# Patient Record
Sex: Female | Born: 1995 | Race: Black or African American | Hispanic: No | Marital: Single | Smoking: Never smoker
Health system: Southern US, Community
[De-identification: ages and names within clinical notes are randomized; demographics above are authoritative.]

## PROBLEM LIST (undated history)

## (undated) HISTORY — PX: ARTHROSCOPIC REPAIR ACL: SUR80

---

## 2017-01-04 ENCOUNTER — Emergency Department (HOSPITAL_COMMUNITY)
Admission: EM | Admit: 2017-01-04 | Discharge: 2017-01-04 | Disposition: A | Discharge status: Home / Self Care | Payer: Self-pay | Attending: Emergency Medicine | Admitting: Emergency Medicine

## 2017-01-04 ENCOUNTER — Encounter (HOSPITAL_COMMUNITY): Payer: Self-pay | Admitting: Oncology

## 2017-01-04 DIAGNOSIS — R112 Nausea with vomiting, unspecified: Secondary | ICD-10-CM | POA: Insufficient documentation

## 2017-01-04 DIAGNOSIS — R197 Diarrhea, unspecified: Secondary | ICD-10-CM | POA: Insufficient documentation

## 2017-01-04 LAB — CBC
HCT: 38.3 % (ref 36.0–46.0)
HEMOGLOBIN: 12.9 g/dL (ref 12.0–15.0)
MCH: 30 pg (ref 26.0–34.0)
MCHC: 33.7 g/dL (ref 30.0–36.0)
MCV: 89.1 fL (ref 78.0–100.0)
Platelets: 268 10*3/uL (ref 150–400)
RBC: 4.3 MIL/uL (ref 3.87–5.11)
RDW: 12.5 % (ref 11.5–15.5)
WBC: 12.1 10*3/uL — ABNORMAL HIGH (ref 4.0–10.5)

## 2017-01-04 LAB — URINALYSIS, ROUTINE W REFLEX MICROSCOPIC
Bilirubin Urine: NEGATIVE
Glucose, UA: NEGATIVE mg/dL
Hgb urine dipstick: NEGATIVE
Ketones, ur: NEGATIVE mg/dL
Leukocytes, UA: NEGATIVE
NITRITE: NEGATIVE
Protein, ur: NEGATIVE mg/dL
SPECIFIC GRAVITY, URINE: 1.025 (ref 1.005–1.030)
pH: 6 (ref 5.0–8.0)

## 2017-01-04 LAB — COMPREHENSIVE METABOLIC PANEL
ALBUMIN: 4.2 g/dL (ref 3.5–5.0)
ALK PHOS: 83 U/L (ref 38–126)
ALT: 24 U/L (ref 14–54)
ANION GAP: 7 (ref 5–15)
AST: 22 U/L (ref 15–41)
BILIRUBIN TOTAL: 0.5 mg/dL (ref 0.3–1.2)
BUN: 14 mg/dL (ref 6–20)
CALCIUM: 9.1 mg/dL (ref 8.9–10.3)
CO2: 23 mmol/L (ref 22–32)
Chloride: 107 mmol/L (ref 101–111)
Creatinine, Ser: 0.65 mg/dL (ref 0.44–1.00)
GFR calc non Af Amer: >60 mL/min (ref >60)
Glucose, Bld: 122 mg/dL — ABNORMAL HIGH (ref 65–99)
Potassium: 3.6 mmol/L (ref 3.5–5.1)
Sodium: 137 mmol/L (ref 135–145)
TOTAL PROTEIN: 7.6 g/dL (ref 6.5–8.1)

## 2017-01-04 LAB — LIPASE, BLOOD: LIPASE: 25 U/L (ref 11–51)

## 2017-01-04 NOTE — ED Provider Notes (Signed)
[facility]-EMERGENCY DEPT Provider Note   CSN: [id number] Arrival date & time: 01/04/17  0129     History   Chief Complaint Chief Complaint  Patient presents with  . Abdominal Pain    HPI Barbara Mccullough is a 21 y.o. female.  This normally healthy 21 year old female who moved into her apartment yesterday to restart school tomorrow.  She was lying in bed when she had a sudden onset of lower abdominal pain, nausea and explosive diarrheal stool since that time .  Pain has decreased.  She's no longer nauseated, nor feeling like she has to have a bowel movement.  Denies any fever but states she had a chill.      History reviewed. No pertinent past medical history.  There are no active problems to display for this patient.   Past Surgical History:  Procedure Laterality Date  . ARTHROSCOPIC REPAIR ACL Left     OB History    No data available       Home Medications    Prior to Admission medications   Not on File    Family History No family history on file.  Social History Social History  Substance Use Topics  . Smoking status: Never Smoker  . Smokeless tobacco: Never Used  . Alcohol use No     Allergies   Patient has no known allergies.   Review of Systems Review of Systems  Constitutional: Positive for chills.  Gastrointestinal: Positive for abdominal pain, diarrhea, nausea and vomiting.  Genitourinary: Negative for dysuria and vaginal discharge.  Musculoskeletal: Negative for myalgias.  Neurological: Negative.   Hematological: Negative.   Psychiatric/Behavioral: Negative.   All other systems reviewed and are negative.    Physical Exam Updated Vital Signs BP 141/91 (BP Location: Right Arm)   Pulse 92   Temp 98.7 F (37.1 C) (Oral)   Resp 18   Ht 5\' 7"  (1.702 m)   Wt 72.6 kg   LMP 12/11/2016 (Exact Date)   SpO2 100%   BMI 25.06 kg/m   Physical Exam  Constitutional: She is oriented to person, place, and time. She appears well-developed and  well-nourished. No distress.  HENT:  Head: Normocephalic.  Mouth/Throat: Oropharynx is clear and moist.  Eyes: Pupils are equal, round, and reactive to light.  Neck: Normal range of motion.  Cardiovascular: Regular rhythm.  Tachycardia present.   Pulmonary/Chest: Effort normal and breath sounds normal.  Abdominal: Soft.  Musculoskeletal: Normal range of motion.  Neurological: She is alert and oriented to person, place, and time.  Skin: Skin is warm and dry.  Psychiatric: She has a normal mood and affect.  Nursing note and vitals reviewed.    ED Treatments / Results  Labs (all labs ordered are listed, but only abnormal results are displayed) Labs Reviewed  COMPREHENSIVE METABOLIC PANEL - Abnormal; Notable for the following:       Result Value   Glucose, Bld 122 (*)    All other components within normal limits  CBC - Abnormal; Notable for the following:    WBC 12.1 (*)    All other components within normal limits  LIPASE, BLOOD  URINALYSIS, ROUTINE W REFLEX MICROSCOPIC    EKG  EKG Interpretation None       Radiology No results found.  Procedures Procedures (including critical care time)  Medications Ordered in ED Medications - No data to display   Initial Impression / Assessment and Plan / ED Course  I have reviewed the triage vital signs and the  nursing notes.  Pertinent labs & imaging results that were available during my care of the patient were reviewed by me and considered in my medical decision making (see chart for details).  Clinical Course    Patient was evaluated.  She had one episode of acute abdominal pain followed by nausea, vomiting and diarrhea.  She's had no further episodes.  Labs and urine checked, all within normal parameters.  Patient is being discharged home with return instructions   Final Clinical Impressions(s) / ED Diagnoses   Final diagnoses:  Nausea vomiting and diarrhea    New Prescriptions New Prescriptions   No medications  on file     Earley Favor, NP 01/04/17 1610    Gilda Crease, MD 01/05/17 [phone]

## 2017-01-04 NOTE — ED Triage Notes (Signed)
Pt c/o right middle lower abdominal pain x 2 hours.  +nausea.  Rates pain 5/10, aching in nature.

## 2017-01-04 NOTE — Discharge Instructions (Signed)
The night, you were evaluated after he had acute onset of abdominal pain, nausea, vomiting and diarrhea, one episode of the time he arrived in the emergency department your pain was starting to subside and you've had no further episodes of vomiting or diarrhea.  Labs were checked as well as urine all within normal parameters.  Please eat lightly for the next 24 hours.  Return if needed.

## 2020-03-09 ENCOUNTER — Emergency Department (HOSPITAL_COMMUNITY)
Admission: EM | Admit: 2020-03-09 | Discharge: 2020-03-09 | Disposition: A | Discharge status: Home / Self Care | Payer: Managed Care, Other (non HMO) | Attending: Emergency Medicine | Admitting: Emergency Medicine

## 2020-03-09 ENCOUNTER — Other Ambulatory Visit: Payer: Self-pay

## 2020-03-09 DIAGNOSIS — J351 Hypertrophy of tonsils: Secondary | ICD-10-CM | POA: Insufficient documentation

## 2020-03-09 LAB — CBC WITH DIFFERENTIAL/PLATELET
Abs Immature Granulocytes: 0.02 10*3/uL (ref 0.00–0.07)
Basophils Absolute: 0.2 10*3/uL — ABNORMAL HIGH (ref 0.0–0.1)
Basophils Relative: 2 %
Eosinophils Absolute: 0 10*3/uL (ref 0.0–0.5)
Eosinophils Relative: 0 %
HCT: 46.8 % — ABNORMAL HIGH (ref 36.0–46.0)
Hemoglobin: 15.4 g/dL — ABNORMAL HIGH (ref 12.0–15.0)
Immature Granulocytes: 0 %
Lymphocytes Relative: 66 %
Lymphs Abs: 6.5 10*3/uL — ABNORMAL HIGH (ref 0.7–4.0)
MCH: 30.4 pg (ref 26.0–34.0)
MCHC: 32.9 g/dL (ref 30.0–36.0)
MCV: 92.5 fL (ref 80.0–100.0)
Monocytes Absolute: 0.5 10*3/uL (ref 0.1–1.0)
Monocytes Relative: 5 %
Neutro Abs: 2.6 10*3/uL (ref 1.7–7.7)
Neutrophils Relative %: 27 %
Platelets: 205 10*3/uL (ref 150–400)
RBC: 5.06 MIL/uL (ref 3.87–5.11)
RDW: 12.1 % (ref 11.5–15.5)
WBC: 9.8 10*3/uL (ref 4.0–10.5)
nRBC: 0 % (ref 0.0–0.2)

## 2020-03-09 LAB — BASIC METABOLIC PANEL
Anion gap: 10 (ref 5–15)
BUN: 8 mg/dL (ref 6–20)
CO2: 24 mmol/L (ref 22–32)
Calcium: 9.1 mg/dL (ref 8.9–10.3)
Chloride: 103 mmol/L (ref 98–111)
Creatinine, Ser: 0.69 mg/dL (ref 0.44–1.00)
GFR calc Af Amer: >60 mL/min (ref >60)
GFR calc non Af Amer: >60 mL/min (ref >60)
Glucose, Bld: 94 mg/dL (ref 70–99)
Potassium: 3.9 mmol/L (ref 3.5–5.1)
Sodium: 137 mmol/L (ref 135–145)

## 2020-03-09 LAB — POC URINE PREG, ED: Preg Test, Ur: NEGATIVE

## 2020-03-09 MED ORDER — DEXAMETHASONE SODIUM PHOSPHATE 10 MG/ML IJ SOLN
10.0000 mg | Freq: Once | INTRAMUSCULAR | Status: AC
  Administered 2020-03-09: 10 mg via INTRAVENOUS
  Filled 2020-03-09: qty 1

## 2020-03-09 MED ORDER — CLINDAMYCIN PHOSPHATE 600 MG/50ML IV SOLN
600.0000 mg | Freq: Once | INTRAVENOUS | Status: AC
  Administered 2020-03-09: 600 mg via INTRAVENOUS
  Filled 2020-03-09: qty 50

## 2020-03-09 MED ORDER — SODIUM CHLORIDE 0.9 % IV BOLUS
1000.0000 mL | Freq: Once | INTRAVENOUS | Status: AC
  Administered 2020-03-09: 1000 mL via INTRAVENOUS

## 2020-03-09 MED ORDER — CLINDAMYCIN HCL 150 MG PO CAPS: 300.0000 mg | ORAL_CAPSULE | Freq: Three times a day (TID) | ORAL | 0 refills | Status: AC

## 2020-03-09 NOTE — ED Triage Notes (Signed)
Patient reports her right tonsil is swollen. Denies pain. Patient went to UC and tested negative for strep and covid at [facility] today. UC sent patient to ED to get ct scan to make sure patient doesn't have abscess on tonsil area.

## 2020-03-09 NOTE — Discharge Instructions (Signed)
Take antibiotics as prescribed.  Take the entire course, even if your symptoms improve. Make sure you are staying well-hydrated water. Use Tylenol or ibuprofen as needed for pain. Monitor your tubes closely.  If you develop persistent fevers, severe worsening pain, muffled voice, difficulty opening your mouth, return to the emergency room.  Return if any new, worsening, concerning symptoms.

## 2020-03-09 NOTE — ED Notes (Signed)
Pt verbalizes understanding of DC instructions. Pt belongings returned and is ambulatory out of ED.    Pt left before signing signature pad  

## 2020-03-09 NOTE — ED Provider Notes (Signed)
[facility] DEPT Provider Note   CSN: [id number] Arrival date & time: 03/09/20  1731     History Chief Complaint  Patient presents with  . swollen tonsil    right    Barbara Mccullough is a 24 y.o. female presenting for evaluation of right-sided tonsillar swelling.  Patient states she noticed swelling of her right side throat this morning.  She denies throat pain.  She denies fevers, chills, nausea, vomiting.  She was seen at urgent care, recommend she come to the ER as they were concerned for possible tonsillar abscess.  She reports no medical problems, takes medications daily.  She has not taken anything for pain.  She is not given anything at the urgent care.  She denies sick contacts.  She denies cough, chest pain, shortness of breath.  HPI     No past medical history on file.  There are no problems to display for this patient.   Past Surgical History:  Procedure Laterality Date  . ARTHROSCOPIC REPAIR ACL Left      OB History   No obstetric history on file.     No family history on file.  Social History   Tobacco Use  . Smoking status: Never Smoker  . Smokeless tobacco: Never Used  Substance Use Topics  . Alcohol use: No  . Drug use: No    Home Medications Prior to Admission medications   Medication Sig Start Date End Date Taking? Authorizing Provider  clindamycin (CLEOCIN) 150 MG capsule Take 2 capsules (300 mg total) by mouth 3 (three) times daily for 7 days. 03/09/20 03/16/20  Krishawn Vanderweele, PA-C    Allergies    Patient has no known allergies.  Review of Systems   Review of Systems  Constitutional: Negative for fever.  HENT:       Right-sided throat swelling    Physical Exam Updated Vital Signs BP (!) 146/107 (BP Location: Left Arm)   Pulse 92   Temp 99.3 F (37.4 C) (Oral)   Resp 18   Ht 5\' 6"  (1.676 m)   LMP 02/08/2020   SpO2 100%   BMI 25.82 kg/m   Physical Exam Vitals and nursing note reviewed.    Constitutional:      General: She is not in acute distress.    Appearance: She is well-developed.     Comments: Resting comfortably in the bed in no acute distress.  Appears nontoxic.  HENT:     Head: Normocephalic and atraumatic.     Right Ear: Tympanic membrane, ear canal and external ear normal.     Left Ear: Tympanic membrane, ear canal and external ear normal.     Nose: Nose normal.     Mouth/Throat:     Tonsils: Tonsillar exudate present. 1+ on the right. 0 on the left.     Comments: Mild right-sided tonsillar swelling with exudate.  No swelling of the left tonsil.  Uvula midline without deviation.  No trismus.  No muffled voice. Eyes:     Conjunctiva/sclera: Conjunctivae normal.     Pupils: Pupils are equal, round, and reactive to light.  Cardiovascular:     Rate and Rhythm: Normal rate and regular rhythm.     Pulses: Normal pulses.  Pulmonary:     Effort: Pulmonary effort is normal. No respiratory distress.     Breath sounds: Normal breath sounds. No wheezing.     Comments: Clear lung sounds in all fields Abdominal:     General: There  is no distension.     Palpations: Abdomen is soft.     Tenderness: There is no abdominal tenderness.  Musculoskeletal:        General: Normal range of motion.     Cervical back: Normal range of motion and neck supple.  Skin:    General: Skin is warm and dry.     Capillary Refill: Capillary refill takes less than 2 seconds.  Neurological:     Mental Status: She is alert and oriented to person, place, and time.     ED Results / Procedures / Treatments   Labs (all labs ordered are listed, but only abnormal results are displayed) Labs Reviewed  CBC WITH DIFFERENTIAL/PLATELET - Abnormal; Notable for the following components:      Result Value   Hemoglobin 15.4 (*)    HCT 46.8 (*)    All other components within normal limits  BASIC METABOLIC PANEL  POC URINE PREG, ED    EKG None  Radiology No results  found.  Procedures Procedures (including critical care time)  Medications Ordered in ED Medications  dexamethasone (DECADRON) injection 10 mg (10 mg Intravenous Given 03/09/20 1858)  sodium chloride 0.9 % bolus 1,000 mL (1,000 mLs Intravenous New Bag/Given 03/09/20 1859)  clindamycin (CLEOCIN) IVPB 600 mg (600 mg Intravenous New Bag/Given 03/09/20 1902)    ED Course  I have reviewed the triage vital signs and the nursing notes.  Pertinent labs & imaging results that were available during my care of the patient were reviewed by me and considered in my medical decision making (see chart for details).  Clinical Course as of Mar 09 1934  Sat Mar 09, 2020  6659 Patient was seen by myself as well as PA provider.  Briefly 24 year old female with no significant past med history presents to ED from urgent care with concern for tonsillar pain and swelling.  He said her symptoms began earlier this morning.  She went to an urgent care when she was told she needs to come to the ED for a CT scan because "she may have a tonsillar abscess."  She denies any drooling.  She denies any fevers or chills.  She denies any difficulty speaking or any changes in her voice.  She denies any prior history of peritonsillar abscess or strep throat.  On exam she is clinically well-appearing.  Her voice is clear not muffled.  There is very mild tonsillar enlargement on the right side but no significant swelling, no uvula deviation.  Her airways pain.  I do not believe she needs a CT scan.  I not believe there is a drainable collection at this time.  We will give her dexamethasone and start her on antibiotics.  Advised her that if her symptoms worsen she should come back to the emergency department, and may need a scan and drainage at that time.  She is content with this plan.   [MT]    Clinical Course User Index [MT] Trifan, Kermit Balo, MD   MDM Rules/Calculators/A&P                      Patient presenting for evaluation of  right-sided tonsillar swelling.  Physical exam reassuring, she appears nontoxic.  She has no trismus or muffled voice.  No uvular deviation.  She has a mildly swollen right side tonsil with exudate.  Exam is not consistent with peritonsillar abscess.  She is afebrile not tachycardic.  Labs interpreted by me, overall reassuring. No leukocytosis.  Electrolytes stable.  Case discussed with attending, Dr. Sharmaine Base evaluated the patient.  He and I agree that exam is not consistent with peritonsillar abscess and patient does not need a CT scan today.  Patient given Decadron, fluids, and clindamycin in the ED.  Will discharge with clindamycin.  Discussed importance of close monitoring of symptoms, prompt return with any trismus or muffled voice.  At this time, patient appears safe for discharge.  Return precautions given.  Patient states she understands and agrees to plan.  Final Clinical Impression(s) / ED Diagnoses Final diagnoses:  Swelling of tonsil    Rx / DC Orders ED Discharge Orders         Ordered    clindamycin (CLEOCIN) 150 MG capsule  3 times daily     03/09/20 1934           Alveria Apley, PA-C 03/09/20 2145    Terald Sleeper, MD 03/10/20 1218

## 2020-03-15 ENCOUNTER — Ambulatory Visit: Payer: Managed Care, Other (non HMO)

## 2020-03-23 ENCOUNTER — Ambulatory Visit: Payer: Managed Care, Other (non HMO) | Attending: Internal Medicine

## 2020-03-23 DIAGNOSIS — Z23 Encounter for immunization: Secondary | ICD-10-CM

## 2020-03-23 NOTE — Progress Notes (Signed)
   Covid-19 Vaccination Clinic  Name:  Barbara Mccullough    MRN: [id number] DOB: 1996/09/08  03/23/2020  [name] was observed post Covid-19 immunization for 15 minutes without incident. She was provided with Vaccine Information Sheet and instruction to access the V-Safe system.   [name] was instructed to call 911 with any severe reactions post vaccine: Marland Kitchen Difficulty breathing  . Swelling of face and throat  . A fast heartbeat  . A bad rash all over body  . Dizziness and weakness   Immunizations Administered    Name Date Dose VIS Date Route   Pfizer COVID-19 Vaccine 03/23/2020  2:33 PM 0.3 mL 12/08/2019 Intramuscular   Manufacturer: [employer], [employer]   Lot: [id number]   NDC: [address]-1

## 2020-04-17 ENCOUNTER — Ambulatory Visit: Payer: Managed Care, Other (non HMO) | Attending: Internal Medicine

## 2020-04-17 DIAGNOSIS — Z23 Encounter for immunization: Secondary | ICD-10-CM

## 2020-04-17 NOTE — Progress Notes (Signed)
   Covid-19 Vaccination Clinic  Name:  Barbara Mccullough    MRN: [id number] DOB: April 10, 1996  04/17/2020  [name] was observed post Covid-19 immunization for 15 minutes without incident. She was provided with Vaccine Information Sheet and instruction to access the V-Safe system.   [name] was instructed to call 911 with any severe reactions post vaccine: Marland Kitchen Difficulty breathing  . Swelling of face and throat  . A fast heartbeat  . A bad rash all over body  . Dizziness and weakness   Immunizations Administered    Name Date Dose VIS Date Route   Pfizer COVID-19 Vaccine 04/17/2020  3:15 PM 0.3 mL 02/21/2019 Intramuscular   Manufacturer: [employer], [employer]   Lot: [id number]   NDC: [address]-5

## 2021-02-23 ENCOUNTER — Encounter: Payer: Self-pay | Admitting: *Deleted

## 2021-02-23 ENCOUNTER — Ambulatory Visit
Admission: EM | Admit: 2021-02-23 | Discharge: 2021-02-23 | Disposition: A | Discharge status: Home / Self Care | Payer: Managed Care, Other (non HMO)

## 2021-02-23 ENCOUNTER — Other Ambulatory Visit: Payer: Self-pay

## 2021-02-23 ENCOUNTER — Ambulatory Visit (INDEPENDENT_AMBULATORY_CARE_PROVIDER_SITE_OTHER): Payer: Managed Care, Other (non HMO)

## 2021-02-23 DIAGNOSIS — M79644 Pain in right finger(s): Secondary | ICD-10-CM

## 2021-02-23 DIAGNOSIS — S63639A Sprain of interphalangeal joint of unspecified finger, initial encounter: Secondary | ICD-10-CM | POA: Diagnosis not present

## 2021-02-23 DIAGNOSIS — W109XXA Fall (on) (from) unspecified stairs and steps, initial encounter: Secondary | ICD-10-CM | POA: Diagnosis not present

## 2021-02-23 MED ORDER — IBUPROFEN 600 MG PO TABS: 600.0000 mg | ORAL_TABLET | Freq: Four times a day (QID) | ORAL | 0 refills | Status: AC | PRN

## 2021-02-23 NOTE — Discharge Instructions (Addendum)
Take 600 mg of ibuprofen combined with 1000 mg of Tylenol together 3-4 times a day as needed for pain.  Wear the splint at all times for the next week or so at least.  Follow-up with hand if you are not better in 3 to 5 weeks.

## 2021-02-23 NOTE — ED Provider Notes (Signed)
HPI  SUBJECTIVE:  Barbara Mccullough is a right-handed 25 y.o. female who presents with right ring finger pain, swelling after falling down some stairs, hyperflexing her finger 3 weeks ago.  She states the pain is located at the PIP.  No bruising, erythema, numbness or tingling, limitation of motion.  She tried ice which made her symptoms worse. No  Alleviating factors.  Past medical history negative for diabetes, smoking.  LMP: Mid of last month.  Denies possibility pregnant.  PMD: None.    History reviewed. No pertinent past medical history.  Past Surgical History:  Procedure Laterality Date  . ARTHROSCOPIC REPAIR ACL Left     Family History  Problem Relation Age of Onset  . Healthy Mother     Social History   Tobacco Use  . Smoking status: Never Smoker  . Smokeless tobacco: Never Used  Vaping Use  . Vaping Use: Never used  Substance Use Topics  . Alcohol use: No  . Drug use: No    No current facility-administered medications for this encounter.  Current Outpatient Medications:  .  ibuprofen (ADVIL) 600 MG tablet, Take 1 tablet (600 mg total) by mouth every 6 (six) hours as needed., Disp: 30 tablet, Rfl: 0 .  UNKNOWN TO PATIENT, Oral contraceptives, Disp: , Rfl:   No Known Allergies   ROS  As noted in HPI.   Physical Exam  BP (!) 140/92   Pulse 80   Temp 98.2 F (36.8 C) (Oral)   Resp 16   LMP 01/15/2021 (Approximate) Comment: Pt takes active back-to-back cycles of oral contraceptives  SpO2 98%   Constitutional: Well developed, well nourished, no acute distress Eyes:  EOMI, conjunctiva normal bilaterally HENT: Normocephalic, atraumatic,mucus membranes moist Respiratory: Normal inspiratory effort Cardiovascular: Normal rate GI: nondistended skin: No rash, skin intact Musculoskeletal: Right ring finger: Tenderness at PIP.  Mild laxity on varus/valgus stress, but not unstable.  No other finger tenderness.  Cap refill less than 2 seconds.  FDP/FDS is intact.   Sensation grossly intact distally.  No swelling, bruising, erythema.  Skin skin intact.  Rest of hand, wrist nontender and within normal limits.   Neurologic: Alert & oriented x 3, no focal neuro deficits Psychiatric: Speech and behavior appropriate   ED Course   Medications - No data to display  Orders Placed This Encounter  Procedures  . DG Finger Ring Right    Standing Status:   Standing    Number of Occurrences:   1    Order Specific Question:   Reason for Exam (SYMPTOM  OR DIAGNOSIS REQUIRED)    Answer:   trauma; pain, decreased ROM    No results found for this or any previous visit (from the past 24 hour(s)). DG Finger Ring Right  Result Date: 02/23/2021 CLINICAL DATA:  falling up some stairs approx 3 wks ago, injuring right ring finger. C/O continued pain and painful ROM of right 4th finger. EXAM: RIGHT RING FINGER 2+V COMPARISON:  None. FINDINGS: There is no evidence of fracture or dislocation. There is no evidence of arthropathy or other focal bone abnormality. Soft tissues are unremarkable. IMPRESSION: No acute osseous abnormality. Electronically Signed   By: Maudry Mayhew MD   On: 02/23/2021 16:24    ED Clinical Impression  1. Sprain of proximal interphalangeal (PIP) joint of finger      ED Assessment/Plan  Reviewed imaging independently.  Normal finger.  See radiology report for full details.  Patient with a PIP sprain.  Mild laxity,  but it is not unstable.  She is neurovascularly intact.  Tendons intact.  Will place in splint, home with Tylenol/ibuprofen.  Follow-up with hand Ortho care [facility] if not better in 3 to 5 weeks.  Discussed imaging, MDM, treatment plan, and plan for follow-up with patient. patient agrees with plan.   Meds ordered this encounter  Medications  . ibuprofen (ADVIL) 600 MG tablet    Sig: Take 1 tablet (600 mg total) by mouth every 6 (six) hours as needed.    Dispense:  30 tablet    Refill:  0    *This clinic note was created  using Scientist, clinical (histocompatibility and immunogenetics). Therefore, there may be occasional mistakes despite careful proofreading.   ?    Domenick Gong, MD 02/24/21 [phone]

## 2021-02-23 NOTE — ED Triage Notes (Signed)
Pt reports falling up some stairs approx 3 wks ago, injuring right ring finger.  C/O continued pain and painful ROM of right 4th finger.

## 2021-04-25 LAB — HM HEPATITIS C SCREENING LAB: HM Hepatitis Screen: NEGATIVE

## 2021-04-25 LAB — HM HIV SCREENING LAB: HM HIV Screening: NEGATIVE

## 2021-04-25 LAB — HIV ANTIBODY (ROUTINE TESTING W REFLEX): HIV 1&2 Ab, 4th Generation: NONREACTIVE

## 2022-04-14 IMAGING — DX DG FINGER RING 2+V*R*
3 series · 3 of 3 positions shown · non-contrast
Comparison: None.

CLINICAL DATA: falling up some stairs approx 3 wks ago, injuring
right ring finger. C/O continued pain and painful ROM of right 4th
finger.

EXAM:
RIGHT RING FINGER 2+V

[finger pa (1 of 2)]
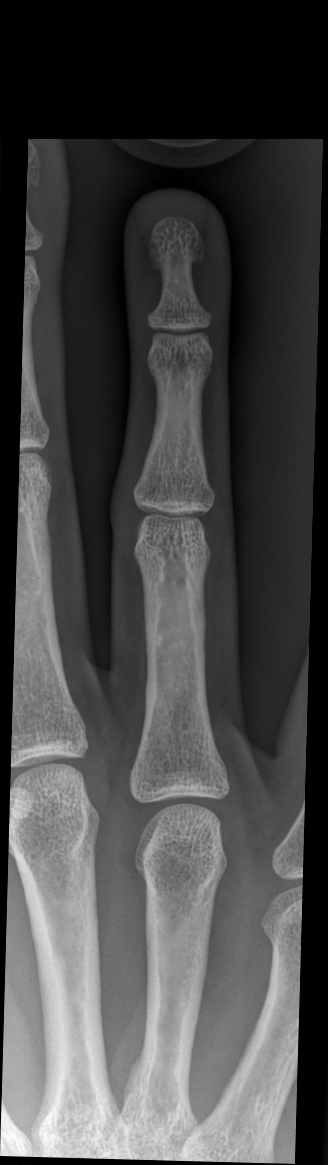

[finger pa (2 of 2)]
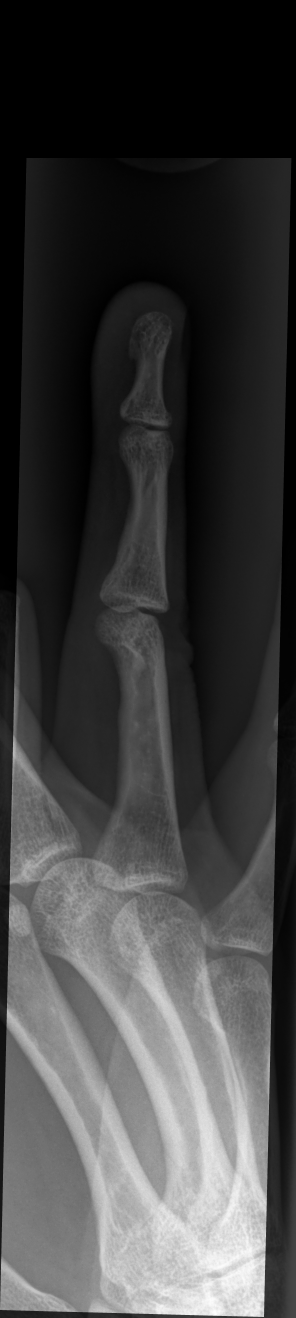

[finger lat]
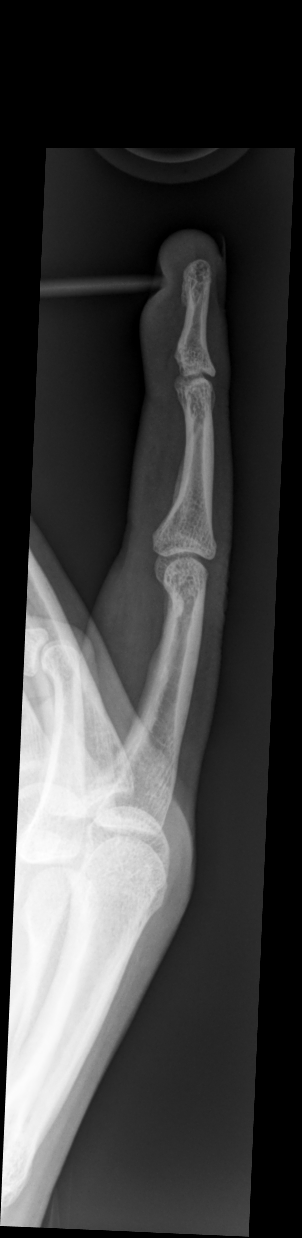

[3 of 3 positions shown; findings below may reference images not displayed]

FINDINGS: There is no evidence of fracture or dislocation. There is no
evidence of arthropathy or other focal bone abnormality. Soft
tissues are unremarkable.
IMPRESSION: No acute osseous abnormality.

## 2022-08-18 LAB — HM PAP SMEAR

## 2023-10-25 ENCOUNTER — Ambulatory Visit
Admission: EM | Admit: 2023-10-25 | Discharge: 2023-10-25 | Disposition: A | Discharge status: Home / Self Care | Payer: 59 | Attending: Internal Medicine | Admitting: Internal Medicine

## 2023-10-25 DIAGNOSIS — R350 Frequency of micturition: Secondary | ICD-10-CM | POA: Diagnosis present

## 2023-10-25 LAB — POCT URINALYSIS DIP (MANUAL ENTRY)
Bilirubin, UA: NEGATIVE
Blood, UA: NEGATIVE
Glucose, UA: NEGATIVE mg/dL
Ketones, POC UA: NEGATIVE mg/dL
Leukocytes, UA: NEGATIVE
Nitrite, UA: NEGATIVE
Protein Ur, POC: 30 mg/dL — AB
Spec Grav, UA: 1.02 (ref 1.010–1.025)
Urobilinogen, UA: 0.2 U/dL
pH, UA: 7 (ref 5.0–8.0)

## 2023-10-25 LAB — POCT URINE PREGNANCY: Preg Test, Ur: NEGATIVE

## 2023-10-25 MED ORDER — PHENAZOPYRIDINE HCL 200 MG PO TABS: 200.0000 mg | ORAL_TABLET | Freq: Three times a day (TID) | ORAL | 0 refills | Status: DC

## 2023-10-25 NOTE — ED Provider Notes (Signed)
[facility]    CSN: [id number] Arrival date & time: 10/25/23  4098      History   Chief Complaint No chief complaint on file.   HPI Barbara Mccullough is a 27 y.o. female presents for evaluation of UTI symptoms. Pt reports 3 days of urinary frequency, and bladder fullness after urination.  Denies urinary burning, urgency, hematuria, malodorous urine, fevers, nausea/vomiting, flank pain.  No vaginal discharge or STD concern. No abd pain or bloating. She has been drinking a lot of water of the past few months in an effort to be healthy.  No OTC medications have been used since onset.  No other concerns at this time.  HPI  History reviewed. No pertinent past medical history.  There are no problems to display for this patient.   Past Surgical History:  Procedure Laterality Date   ARTHROSCOPIC REPAIR ACL Left     OB History   No obstetric history on file.      Home Medications    Prior to Admission medications   Medication Sig Start Date End Date Taking? Authorizing Provider  phenazopyridine (PYRIDIUM) 200 MG tablet Take 1 tablet (200 mg total) by mouth 3 (three) times daily. 10/25/23  Yes Radford Pax, NP  UNKNOWN TO PATIENT Oral contraceptives   Yes [provider]  ibuprofen (ADVIL) 600 MG tablet Take 1 tablet (600 mg total) by mouth every 6 (six) hours as needed. 02/23/21   Domenick Gong, MD    Family History Family History  Problem Relation Age of Onset   Healthy Mother     Social History Social History   Tobacco Use   Smoking status: Never   Smokeless tobacco: Never  Vaping Use   Vaping status: Never Used  Substance Use Topics   Alcohol use: No   Drug use: No     Allergies   Patient has no known allergies.   Review of Systems Review of Systems  Genitourinary:  Positive for frequency.     Physical Exam Triage Vital Signs ED Triage Vitals  Encounter Vitals Group     BP 10/25/23 0837 (!) 151/96     Systolic BP Percentile  --      Diastolic BP Percentile --      Pulse Rate 10/25/23 0837 (!) 103     Resp 10/25/23 0837 16     Temp 10/25/23 0837 99.2 F (37.3 C)     Temp Source 10/25/23 0837 Oral     SpO2 10/25/23 0837 98 %     Weight --      Height --      Head Circumference --      Peak Flow --      Pain Score 10/25/23 0835 0     Pain Loc --      Pain Education --      Exclude from Growth Chart --    No data found.  Updated Vital Signs BP (!) 151/96 (BP Location: Right Arm)   Pulse (!) 103   Temp 99.2 F (37.3 C) (Oral)   Resp 16   LMP 09/16/2023 (Approximate)   SpO2 98%   Visual Acuity Right Eye Distance:   Left Eye Distance:   Bilateral Distance:    Right Eye Near:   Left Eye Near:    Bilateral Near:     Physical Exam Vitals and nursing note reviewed.  Constitutional:      Appearance: Normal appearance.  HENT:     Head:  Normocephalic and atraumatic.  Eyes:     Pupils: Pupils are equal, round, and reactive to light.  Cardiovascular:     Rate and Rhythm: Normal rate.  Pulmonary:     Effort: Pulmonary effort is normal.  Abdominal:     Tenderness: There is no right CVA tenderness or left CVA tenderness.  Skin:    General: Skin is warm and dry.  Neurological:     General: No focal deficit present.     Mental Status: She is alert and oriented to person, place, and time.  Psychiatric:        Mood and Affect: Mood normal.        Behavior: Behavior normal.      UC Treatments / Results  Labs (all labs ordered are listed, but only abnormal results are displayed) Labs Reviewed  POCT URINALYSIS DIP (MANUAL ENTRY) - Abnormal; Notable for the following components:      Result Value   Protein Ur, POC =30 (*)    All other components within normal limits  URINE CULTURE  POCT URINE PREGNANCY    EKG   Radiology No results found.  Procedures Procedures (including critical care time)  Medications Ordered in UC Medications - No data to display  Initial Impression /  Assessment and Plan / UC Course  I have reviewed the triage vital signs and the nursing notes.  Pertinent labs & imaging results that were available during my care of the patient were reviewed by me and considered in my medical decision making (see chart for details).  Clinical Course as of 10/25/23 0900  Mon Oct 25, 2023  0900 HR recheck 87 [JM]    Clinical Course User Index [JM] Radford Pax, NP    Reviewed exam and sx with patient. No red flags. UA neg UTI, will culture given sx. Trial of pyridium. Pt declined STD testing. Discussed if sx persist advised Pcp follow up for possible additional testing, ie bladder scan. Pt declined eval at [facility] today preferring to await culture results. ER precautions reviewed and pt verbalized understanding.  Final Clinical Impressions(s) / UC Diagnoses   Final diagnoses:  Urinary frequency     Discharge Instructions      The clinic will contact you with results of urine culture done today if positive.  You may take pyridium as needed for symptoms. Please note this medication will make your urine orange. Continue rest and fluids.  Please follow-up with your PCP if your symptoms do not improve.  Please go to the ER for any worsening symptoms.  Hope you feel better soon!    ED Prescriptions     Medication Sig Dispense Auth. Provider   phenazopyridine (PYRIDIUM) 200 MG tablet Take 1 tablet (200 mg total) by mouth 3 (three) times daily. 6 tablet Radford Pax, NP      PDMP not reviewed this encounter.   Radford Pax, NP 10/25/23 0900

## 2023-10-25 NOTE — Discharge Instructions (Addendum)
The clinic will contact you with results of urine culture done today if positive.  You may take pyridium as needed for symptoms. Please note this medication will make your urine orange. Continue rest and fluids.  Please follow-up with your PCP if your symptoms do not improve.  Please go to the ER for any worsening symptoms.  Hope you feel better soon!

## 2023-10-25 NOTE — ED Triage Notes (Signed)
Pt presents to UC w/ c/o "bladder still feeling full" after urinating x3 days. Denies pain or bleeding.

## 2023-10-26 LAB — URINE CULTURE: Culture: <10000 — AB

## 2023-11-11 ENCOUNTER — Ambulatory Visit
Admission: EM | Admit: 2023-11-11 | Discharge: 2023-11-11 | Disposition: A | Discharge status: Home / Self Care | Payer: 59 | Attending: Internal Medicine | Admitting: Internal Medicine

## 2023-11-11 ENCOUNTER — Encounter: Payer: Self-pay | Admitting: Emergency Medicine

## 2023-11-11 DIAGNOSIS — I10 Essential (primary) hypertension: Secondary | ICD-10-CM

## 2023-11-11 NOTE — ED Provider Notes (Signed)
[facility]  Note:  This document was prepared using Conservation officer, historic buildings and may include unintentional dictation errors.  MRN: [id number] DOB: 22-Jan-1996  Subjective:   Barbara Mccullough is a 27 y.o. female presenting for evaluation for her blood pressure. Has consistently been elevated 140s systolic over 100s diastolic. Has occasional headaches, transient chest pains after doing strenuous work activities. No confusion, weakness, numbness or tingling, shob, wheezing, diaphoresis, n/v, abdominal pain. Admits diet is high in sodium. Has never had to take blood pressure medications.   No current facility-administered medications for this encounter.  Current Outpatient Medications:    ibuprofen (ADVIL) 600 MG tablet, Take 1 tablet (600 mg total) by mouth every 6 (six) hours as needed., Disp: 30 tablet, Rfl: 0   phenazopyridine (PYRIDIUM) 200 MG tablet, Take 1 tablet (200 mg total) by mouth 3 (three) times daily., Disp: 6 tablet, Rfl: 0   UNKNOWN TO PATIENT, Oral contraceptives, Disp: , Rfl:    No Known Allergies  History reviewed. No pertinent past medical history.   Past Surgical History:  Procedure Laterality Date   ARTHROSCOPIC REPAIR ACL Left     Family History  Problem Relation Age of Onset   Healthy Mother     Social History   Tobacco Use   Smoking status: Never   Smokeless tobacco: Never  Vaping Use   Vaping status: Never Used  Substance Use Topics   Alcohol use: No   Drug use: No    ROS   Objective:   Vitals: BP (!) 147/91 (BP Location: Right Arm)   Pulse 98   Temp 98.4 F (36.9 C) (Oral)   Resp 16   LMP 09/16/2023 (Approximate)   SpO2 98%   Physical Exam Constitutional:      General: She is not in acute distress.    Appearance: Normal appearance. She is well-developed. She is not ill-appearing, toxic-appearing or diaphoretic.  HENT:     Head: Normocephalic and atraumatic.     Nose: Nose normal.     Mouth/Throat:      Mouth: Mucous membranes are moist.  Eyes:     General: No scleral icterus.       Right eye: No discharge.        Left eye: No discharge.     Extraocular Movements: Extraocular movements intact.  Cardiovascular:     Rate and Rhythm: Normal rate and regular rhythm.     Heart sounds: Normal heart sounds. No murmur heard.    No friction rub. No gallop.  Pulmonary:     Effort: Pulmonary effort is normal. No respiratory distress.     Breath sounds: No stridor. No wheezing, rhonchi or rales.  Chest:     Chest wall: No tenderness.  Skin:    General: Skin is warm and dry.  Neurological:     General: No focal deficit present.     Mental Status: She is alert and oriented to person, place, and time.     Cranial Nerves: No cranial nerve deficit.     Motor: No weakness.     Coordination: Coordination normal.     Gait: Gait normal.  Psychiatric:        Mood and Affect: Mood normal.        Behavior: Behavior normal.     Assessment and Plan :   PDMP not reviewed this encounter.  1. Essential hypertension    Patient declined medical management for now.  Prefers to try dietary  modifications.  Will establish care with a new PCP for recheck.  Counseled patient on potential for adverse effects with medications prescribed/recommended today, ER and return-to-clinic precautions discussed, patient verbalized understanding.    Wallis Bamberg, [place] 11/11/23 [phone]

## 2023-11-11 NOTE — ED Triage Notes (Signed)
One week ago her provider mentioned she had high blood pressure. 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 Denies fever, runny nose, cough, sore throat, visual/hearing abnormalities, SOB, cough, palpitations, wheezing, abdominal pain, N/V/D, changes to bowel/bladder habits Occasional headaches and a small cramp in her chest that came and went after lifting heavy boxes at work last night.

## 2023-11-11 NOTE — Discharge Instructions (Signed)
For diabetes or elevated blood sugar, please make sure you are limiting and avoiding starchy, carbohydrate foods like pasta, breads, sweet breads, pastry, rice, potatoes, desserts. These foods can elevate your blood sugar. Also, limit and avoid drinks that contain a lot of sugar such as sodas, sweet teas, fruit juices.  Drinking plain water will be much more helpful, try 64 ounces of water daily.  It is okay to flavor your water naturally by cutting cucumber, lemon, mint or lime, placing it in a picture with water and drinking it over a period of 24-48 hours as long as it remains refrigerated. ? ?For elevated blood pressure, make sure you are monitoring salt in your diet.  Do not eat restaurant foods and limit processed foods at home. I highly recommend you prepare and cook your own foods at home.  Processed foods include things like frozen meals, pre-seasoned meats and dinners, deli meats, canned foods as these foods contain a high amount of sodium/salt.  Make sure you are paying attention to sodium labels on foods you buy at the grocery store. Buy your spices separately such as garlic powder, onion powder, cumin, cayenne, parsley flakes so that you can avoid seasonings that contain salt. However, salt-free seasonings are available and can be used, an example is Mrs. Dash and includes a lot of different mixtures that do not contain salt. ? ?Lastly, when cooking using oils that are healthier for you is important. This includes olive oil, avocado oil, canola oil. We have discussed a lot of foods to avoid but below is a list of foods that can be very healthy to use in your diet whether it is for diabetes, cholesterol, high blood pressure, or in general healthy eating. ? ?Salads - kale, spinach, cabbage, spring mix, arugula ?Fruits - avocadoes, berries (blueberries, raspberries, blackberries), apples, oranges, pomegranate, grapefruit, kiwi ?Vegetables - asparagus, cauliflower, broccoli, green beans, brussel sprouts,  bell peppers, beets; stay away from or limit starchy vegetables like potatoes, carrots, peas ?Other general foods - kidney beans, egg whites, almonds, walnuts, sunflower seeds, pumpkin seeds, fat free yogurt, almond milk, flax seeds, quinoa, oats  ?Meat - It is better to eat lean meats and limit your red meat including pork to once a week.  Wild caught fish, chicken breast are good options as they tend to be leaner sources of good protein. Still be mindful of the sodium labels for the meats you buy. ? ?DO NOT EAT ANY FOODS ON THIS LIST THAT YOU ARE ALLERGIC TO. For more specific needs, I highly recommend consulting a dietician or nutritionist but this can definitely be a good starting point. ? ?

## 2023-11-15 ENCOUNTER — Encounter: Payer: Self-pay | Admitting: Urgent Care

## 2023-11-15 ENCOUNTER — Ambulatory Visit: Payer: 59 | Admitting: Urgent Care

## 2023-11-15 VITALS — BP 146/96 | HR 103 | Temp 99.2°F | Ht 65.0 in | Wt 158.4 lb

## 2023-11-15 DIAGNOSIS — Z13220 Encounter for screening for lipoid disorders: Secondary | ICD-10-CM | POA: Diagnosis not present

## 2023-11-15 DIAGNOSIS — Z3009 Encounter for other general counseling and advice on contraception: Secondary | ICD-10-CM | POA: Diagnosis not present

## 2023-11-15 DIAGNOSIS — R809 Proteinuria, unspecified: Secondary | ICD-10-CM | POA: Diagnosis not present

## 2023-11-15 DIAGNOSIS — R030 Elevated blood-pressure reading, without diagnosis of hypertension: Secondary | ICD-10-CM | POA: Insufficient documentation

## 2023-11-15 LAB — CBC WITH DIFFERENTIAL/PLATELET
Basophils Absolute: 0.1 10*3/uL (ref 0.0–0.1)
Basophils Relative: 0.9 % (ref 0.0–3.0)
Eosinophils Absolute: 0 10*3/uL (ref 0.0–0.7)
Eosinophils Relative: 0.5 % (ref 0.0–5.0)
HCT: 48.6 % — ABNORMAL HIGH (ref 36.0–46.0)
Hemoglobin: 16 g/dL — ABNORMAL HIGH (ref 12.0–15.0)
Lymphocytes Relative: 23.8 % (ref 12.0–46.0)
Lymphs Abs: 1.4 10*3/uL (ref 0.7–4.0)
MCHC: 32.8 g/dL (ref 30.0–36.0)
MCV: 95.5 fL (ref 78.0–100.0)
Monocytes Absolute: 0.3 10*3/uL (ref 0.1–1.0)
Monocytes Relative: 4.3 % (ref 3.0–12.0)
Neutro Abs: 4.2 10*3/uL (ref 1.4–7.7)
Neutrophils Relative %: 70.5 % (ref 43.0–77.0)
Platelets: 290 10*3/uL (ref 150.0–400.0)
RBC: 5.09 Mil/uL (ref 3.87–5.11)
RDW: 12.9 % (ref 11.5–15.5)
WBC: 6 10*3/uL (ref 4.0–10.5)

## 2023-11-15 LAB — POCT URINALYSIS DIPSTICK
Bilirubin, UA: NEGATIVE
Blood, UA: POSITIVE
Glucose, UA: NEGATIVE
Ketones, UA: NEGATIVE
Leukocytes, UA: NEGATIVE
Nitrite, UA: NEGATIVE
Protein, UA: NEGATIVE
Spec Grav, UA: 1.02 (ref 1.010–1.025)
Urobilinogen, UA: 0.2 U/dL
pH, UA: 6 (ref 5.0–8.0)

## 2023-11-15 LAB — TSH: TSH: 4.03 u[IU]/mL (ref 0.35–5.50)

## 2023-11-15 NOTE — Progress Notes (Signed)
 New Patient Office Visit  Subjective:  Patient ID: Barbara Mccullough, female    DOB: [birth year]  Age: 27 y.o. MRN: [id number]  CC:  Chief Complaint  Patient presents with   Establish Care    New Pt est care and elevated BP   Discussed the use of AI software for clinical note transcription with the patient who gave verbal consent to proceed.   HPI Barbara Mccullough presents to establish care  History of Present Illness Pleasant 27 year old female with no known PMH, presents with concerns about elevated blood pressure and urinary symptoms. The patient initially sought care at an urgent care center due to difficulties with urination, described as an inability to fully empty the bladder. At that time, protein was detected in the urine, but no urinary tract infection was identified. The patient was treated with Pyridium, which resolved the urinary symptoms. Urine culture showed insignificant growth.  The patient's blood pressure was also noted to be elevated during the urgent care visit and subsequently at an OB/GYN appointment, with readings as high as 141/88. The patient has been monitoring her blood pressure at home, with readings up to 130/98. The patient reports feeling overwhelmed and stressed about the diagnosis of hypertension, which she believes may be contributing to the elevated readings.  The patient has been on combination estrogen-progesterone birth control for a year, primarily for menstrual regulation. The patient reports no other significant medical history or medication use. The patient has made dietary changes in the past six months, reducing fast food intake from daily to twice a week.  The patient also reports engaging in regular physical activity, including weightlifting and walking on a treadmill, which was started about six months ago. The patient has not experienced any chest pain or lightheadedness during these activities. However, she has noticed episodes of R-sided chest pain  at work, which prompted a return visit to the urgent care center. She states the pains seem to only come at work (works in a [employer]) and will get a sharp pain to R side of sternum that will last a few seconds and resolve without treatment. Only gets 1-2 episodes on the days she works. Denies any active sx in office.  The patient's family history is significant for hypertension in the paternal grandfather. The patient denies any other familial history of cardiovascular disease.   Outpatient Encounter Medications as of 11/15/2023  Medication Sig   [DISCONTINUED] SIMPESSE 0.15-0.03 &0.01 MG tablet Take 1 tablet by mouth daily.   ibuprofen (ADVIL) 600 MG tablet Take 1 tablet (600 mg total) by mouth every 6 (six) hours as needed. (Patient not taking: Reported on 11/15/2023)   UNKNOWN TO PATIENT Oral contraceptives (Patient not taking: Reported on 11/15/2023)   [DISCONTINUED] phenazopyridine (PYRIDIUM) 200 MG tablet Take 1 tablet (200 mg total) by mouth 3 (three) times daily. (Patient not taking: Reported on 11/15/2023)   No facility-administered encounter medications on file as of 11/15/2023.    History reviewed. No pertinent past medical history.  Past Surgical History:  Procedure Laterality Date   ARTHROSCOPIC REPAIR ACL Left     Family History  Problem Relation Age of Onset   Healthy Mother    Hyperlipidemia Maternal Grandfather    Rheum arthritis Maternal Grandfather    Depression Maternal Grandfather    Hearing loss Maternal Grandfather     Social History   Socioeconomic History   Marital status: Single    Spouse name: Not on file   Number of  children: Not on file   Years of education: Not on file   Highest education level: Master's degree (e.g., MA, MS, MEng, MEd, MSW, MBA)  Occupational History   Not on file  Tobacco Use   Smoking status: Never   Smokeless tobacco: Never  Vaping Use   Vaping status: Never Used  Substance and Sexual Activity   Alcohol  use: Yes   Drug use: No   Sexual activity: Yes    Partners: Male    Birth control/protection: Pill  Other Topics Concern   Not on file  Social History Narrative   Not on file   Social Determinants of Health   Financial Resource Strain: Not on file  Food Insecurity: Not on file  Transportation Needs: Not on file  Physical Activity: Not on file  Stress: Not on file  Social Connections: Not on file  Intimate Partner Violence: Not on file    ROS: as noted in HPI  Objective:  BP (!) 146/96   Pulse (!) 103   Temp 99.2 F (37.3 C) (Oral)   Ht 5\' 5"  (1.651 m)   Wt 158 lb 6.4 oz (71.8 kg)   LMP 09/16/2023 (Approximate)   SpO2 100%   BMI 26.36 kg/m   Physical Exam Vitals and nursing note reviewed.  Constitutional:      General: She is not in acute distress.    Appearance: Normal appearance. She is not ill-appearing, toxic-appearing or diaphoretic.  HENT:     Head: Normocephalic and atraumatic.     Right Ear: Tympanic membrane, ear canal and external ear normal. There is no impacted cerumen.     Left Ear: Tympanic membrane, ear canal and external ear normal. There is no impacted cerumen.     Nose: Nose normal.     Mouth/Throat:     Mouth: Mucous membranes are moist.     Pharynx: Oropharynx is clear. No oropharyngeal exudate or posterior oropharyngeal erythema.  Eyes:     General: No scleral icterus.       Right eye: No discharge.        Left eye: No discharge.     Extraocular Movements: Extraocular movements intact.     Pupils: Pupils are equal, round, and reactive to light.  Neck:     Thyroid: No thyroid mass, thyromegaly or thyroid tenderness.  Cardiovascular:     Rate and Rhythm: Normal rate and regular rhythm.     Pulses: Normal pulses.     Heart sounds: No murmur heard. Pulmonary:     Effort: Pulmonary effort is normal. No respiratory distress.     Breath sounds: Normal breath sounds. No stridor. No wheezing or rhonchi.  Abdominal:     General: Abdomen is  flat. Bowel sounds are normal. There is no distension.     Palpations: Abdomen is soft. There is no mass.     Tenderness: There is no abdominal tenderness. There is no guarding.  Musculoskeletal:     Cervical back: Normal range of motion and neck supple. No rigidity or tenderness.     Right lower leg: No edema.     Left lower leg: No edema.  Lymphadenopathy:     Cervical: No cervical adenopathy.  Skin:    General: Skin is warm and dry.     Coloration: Skin is not jaundiced.     Findings: No bruising, erythema or rash.  Neurological:     General: No focal deficit present.     Mental Status: She is alert  and oriented to person, place, and time.     Sensory: No sensory deficit.     Motor: No weakness.  Psychiatric:        Mood and Affect: Mood normal.        Behavior: Behavior normal.     Lab Results  Component Value Date   COLORU yellow 11/15/2023   CLARITYU clear 11/15/2023   GLUCOSEUR Negative 11/15/2023   BILIRUBINUR Negative 11/15/2023   KETONESU Negative 11/15/2023   SPECGRAV 1.020 11/15/2023   RBCUR trace 11/15/2023   PHUR 6.0 11/15/2023   PROTEINUR Negative 11/15/2023   UROBILINOGEN 0.2 11/15/2023   LEUKOCYTESUR Negative 11/15/2023     Last CBC Lab Results  Component Value Date   WBC 9.8 03/09/2020   HGB 15.4 (H) 03/09/2020   HCT 46.8 (H) 03/09/2020   MCV 92.5 03/09/2020   MCH 30.4 03/09/2020   RDW 12.1 03/09/2020   PLT 205 03/09/2020   Last metabolic panel Lab Results  Component Value Date   GLUCOSE 94 03/09/2020   NA 137 03/09/2020   K 3.9 03/09/2020   CL 103 03/09/2020   CO2 24 03/09/2020   BUN 8 03/09/2020   CREATININE 0.69 03/09/2020   GFRNONAA >60 03/09/2020   CALCIUM 9.1 03/09/2020   PROT 7.6 01/04/2017   ALBUMIN 4.2 01/04/2017   BILITOT 0.5 01/04/2017   ALKPHOS 83 01/04/2017   AST 22 01/04/2017   ALT 24 01/04/2017   ANIONGAP 10 03/09/2020   Last lipids No results found for: "CHOL", "HDL", "LDLCALC", "LDLDIRECT", "TRIG",  "CHOLHDL" Last thyroid functions No results found for: "TSH", "T3TOTAL", "T4TOTAL", "THYROIDAB"    Assessment & Plan:  Proteinuria, unspecified type Assessment & Plan: Single episode of protein in urine during urgent care visit for urinary symptoms. No recurrent urinary symptoms. Patient reports new weightlifting regimen. -Collect urine sample today for analysis, which is NEGATIVE for protein. -Consider potential impact of new weightlifting regimen on transient proteinuria.  Orders: -     POCT urinalysis dipstick -     Lipid panel -     Comprehensive metabolic panel -     CBC with Differential/Platelet -     TSH  Blood pressure elevated without history of HTN Assessment & Plan: Elevated blood pressure readings at multiple visits, including today's reading of 138/102. Patient reports stress and anxiety related to diagnosis. Discussed potential causes including salt intake, estrogen-containing birth control, and need for increased cardiovascular activity. Family history of hypertension in grandparents only, not parents or siblings. -Advise increased cardiovascular activity to target heart rate (193 bpm). -Consider potential impact of estrogen-containing birth control on blood pressure. Recommended pt STOP her current OCP, discuss alternative options with gyne at your 11/30/23 follow up visit. -DASH diet recommended, cut back on salt intake. -Monitor BP readings at home, particularly after stopping OCPs  Orders: -     POCT urinalysis dipstick -     Lipid panel -     Comprehensive metabolic panel -     CBC with Differential/Platelet -     TSH  Advised about oral contraception Assessment & Plan: Currently on combination estrogen-progesterone birth control for menstrual regulation. Discussed potential impact on blood pressure and alternatives if necessary. -BP in 2022 prior to current OCP was normal at 112/78. -STOP current OCP.  Discuss at gynecology follow up possibly switching to  progesterone-only birth control.   Lipid screening -     Lipid panel    Return in about 3 weeks (around 12/06/2023).     Manley Mason, PA

## 2023-11-15 NOTE — Patient Instructions (Addendum)
Please stop taking your birth control today. I assume your blood pressure issues may be related to your supplemental estrogen. You were on Lo-estrin previously with normal BP readings. Consider switching back to this, or get a progesterone only birth control option. Discuss this with your gynecologist at your scheduled follow up visit.  Please cut back on salt in your diet. Try to eat less than 2-3g daily from all sources. Never add salt to your food.  Try to do cardiovascular exercise for 30 minutes 5 days a week. This will help with blood pressure as well.  Monitor your blood pressure at home, our goal is <130/90, preferably closer to 120/80.  Return to the office in 3 weeks to recheck and manage your blood pressure. We will review your labs at this time too.

## 2023-11-15 NOTE — Assessment & Plan Note (Signed)
Elevated blood pressure readings at multiple visits, including today's reading of 138/102. Patient reports stress and anxiety related to diagnosis. Discussed potential causes including salt intake, estrogen-containing birth control, and need for increased cardiovascular activity. Family history of hypertension in grandparents only, not parents or siblings. -Advise increased cardiovascular activity to target heart rate (193 bpm). -Consider potential impact of estrogen-containing birth control on blood pressure. Recommended pt STOP her current OCP, discuss alternative options with gyne at your 11/30/23 follow up visit. -DASH diet recommended, cut back on salt intake. -Monitor BP readings at home, particularly after stopping OCPs

## 2023-11-15 NOTE — Assessment & Plan Note (Signed)
Currently on combination estrogen-progesterone birth control for menstrual regulation. Discussed potential impact on blood pressure and alternatives if necessary. -BP in 2022 prior to current OCP was normal at 112/78. -STOP current OCP.  Discuss at gynecology follow up possibly switching to progesterone-only birth control.

## 2023-11-15 NOTE — Assessment & Plan Note (Signed)
Single episode of protein in urine during urgent care visit for urinary symptoms. No recurrent urinary symptoms. Patient reports new weightlifting regimen. -Collect urine sample today for analysis, which is NEGATIVE for protein. -Consider potential impact of new weightlifting regimen on transient proteinuria.

## 2023-11-16 LAB — COMPREHENSIVE METABOLIC PANEL
ALT: 20 U/L (ref 0–35)
AST: 18 U/L (ref 0–37)
Albumin: 4.7 g/dL (ref 3.5–5.2)
Alkaline Phosphatase: 54 U/L (ref 39–117)
BUN: 10 mg/dL (ref 6–23)
CO2: 23 meq/L (ref 19–32)
Calcium: 9.6 mg/dL (ref 8.4–10.5)
Chloride: 104 meq/L (ref 96–112)
Creatinine, Ser: 0.79 mg/dL (ref 0.40–1.20)
GFR: 102.55 mL/min (ref >60.00)
Glucose, Bld: 91 mg/dL (ref 70–99)
Potassium: 4.4 meq/L (ref 3.5–5.1)
Sodium: 137 meq/L (ref 135–145)
Total Bilirubin: 0.5 mg/dL (ref 0.2–1.2)
Total Protein: 7.5 g/dL (ref 6.0–8.3)

## 2023-11-16 LAB — LIPID PANEL
Cholesterol: 272 mg/dL — ABNORMAL HIGH (ref 0–200)
HDL: 52.1 mg/dL (ref >39.00)
LDL Cholesterol: 201 mg/dL — ABNORMAL HIGH (ref 0–99)
NonHDL: 219.89
Total CHOL/HDL Ratio: 5
Triglycerides: 94 mg/dL (ref 0.0–149.0)
VLDL: 18.8 mg/dL (ref 0.0–40.0)

## 2024-06-05 ENCOUNTER — Ambulatory Visit (INDEPENDENT_AMBULATORY_CARE_PROVIDER_SITE_OTHER): Admitting: Urgent Care

## 2024-06-05 ENCOUNTER — Encounter: Payer: Self-pay | Admitting: Urgent Care

## 2024-06-05 VITALS — BP 136/88 | HR 93 | Wt 149.8 lb

## 2024-06-05 DIAGNOSIS — D582 Other hemoglobinopathies: Secondary | ICD-10-CM

## 2024-06-05 DIAGNOSIS — K5900 Constipation, unspecified: Secondary | ICD-10-CM

## 2024-06-05 DIAGNOSIS — R195 Other fecal abnormalities: Secondary | ICD-10-CM | POA: Diagnosis not present

## 2024-06-05 DIAGNOSIS — R030 Elevated blood-pressure reading, without diagnosis of hypertension: Secondary | ICD-10-CM | POA: Diagnosis not present

## 2024-06-05 MED ORDER — LINACLOTIDE 145 MCG PO CAPS: 145.0000 ug | ORAL_CAPSULE | Freq: Every day | ORAL | 0 refills | Status: AC

## 2024-06-05 NOTE — Progress Notes (Signed)
 Established Patient Office Visit  Subjective:  Patient ID: Barbara Mccullough, female    DOB: 17-Jul-1996  Age: 28 y.o. MRN: [id number]  Chief Complaint  Patient presents with   Follow-up    Follow up on BP. She has been having issues with constipation and diarrhea for about a year now.    HPI  Discussed the use of AI scribe software for clinical note transcription with the patient, who gave verbal consent to proceed.  History of Present Illness   Barbara Mccullough is a 28 year old female who presents with gastrointestinal symptoms.  She has been experiencing gastrointestinal symptoms for about a year, characterized by alternating constipation and diarrhea. Constipation is predominant, with bowel movements occurring every two to three days, often requiring straining. Stools are sometimes formed, sometimes in small pellets, and occasionally accompanied by mucus. She experiences sharp abdominal pain that necessitates immediate bowel movements about once a week. No blood in the stool, pain during bowel movements, or significant changes in diet or activity level, except for stopping soda consumption. She occasionally uses laxatives, about once or twice a month, and has tried MiraLAX once without success.  Regarding her blood pressure, she stopped taking birth control, which she believes contributed to her previously elevated blood pressure. Since discontinuing the birth control, her blood pressure has decreased. She monitors her blood pressure at home, reporting a typical reading of 133/75 mmHg, which is slightly elevated but within normal limits. She is not using any form of contraception at this time.  She does not smoke and is physically active. No nausea, vomiting, or pain associated with eating. She also denies any recent rashes, except for a resolved rash between her chest a year ago.      Patient Active Problem List   Diagnosis Date Noted   Proteinuria 11/15/2023   Blood pressure elevated  without history of HTN 11/15/2023   Advised about oral contraception 11/15/2023   History reviewed. No pertinent past medical history. Past Surgical History:  Procedure Laterality Date   ARTHROSCOPIC REPAIR ACL Left    Social History   Tobacco Use   Smoking status: Never   Smokeless tobacco: Never  Vaping Use   Vaping status: Never Used  Substance Use Topics   Alcohol use: Yes   Drug use: No      ROS: as noted in HPI  Objective:     BP 136/88   Pulse 93   Wt 149 lb 12.8 oz (67.9 kg)   SpO2 100%   BMI 24.93 kg/m  BP Readings from Last 3 Encounters:  06/05/24 136/88  11/15/23 (!) 146/96  11/11/23 (!) 147/91   Wt Readings from Last 3 Encounters:  06/05/24 149 lb 12.8 oz (67.9 kg)  11/15/23 158 lb 6.4 oz (71.8 kg)  01/04/17 160 lb (72.6 kg)      Physical Exam Vitals and nursing note reviewed.  Constitutional:      General: She is not in acute distress.    Appearance: Normal appearance. She is normal weight. She is not ill-appearing, toxic-appearing or diaphoretic.  HENT:     Head: Normocephalic and atraumatic.     Nose: Nose normal.     Mouth/Throat:     Mouth: Mucous membranes are moist.  Eyes:     General: No scleral icterus.       Right eye: No discharge.        Left eye: No discharge.     Extraocular Movements: Extraocular movements intact.  Pupils: Pupils are equal, round, and reactive to light.  Cardiovascular:     Rate and Rhythm: Normal rate.  Pulmonary:     Effort: Pulmonary effort is normal. No respiratory distress.  Abdominal:     General: Abdomen is flat. Bowel sounds are normal. There is no distension.     Palpations: Abdomen is soft. There is no mass.     Tenderness: There is no abdominal tenderness. There is no right CVA tenderness, left CVA tenderness, guarding or rebound.     Hernia: No hernia is present.  Skin:    General: Skin is warm and dry.     Coloration: Skin is not jaundiced or pale.     Findings: No bruising or erythema.   Neurological:     Mental Status: She is alert.      No results found for any visits on 06/05/24.  Last CBC Lab Results  Component Value Date   WBC 6.0 11/15/2023   HGB 16.0 (H) 11/15/2023   HCT 48.6 (H) 11/15/2023   MCV 95.5 11/15/2023   MCH 30.4 03/09/2020   RDW 12.9 11/15/2023   PLT 290.0 11/15/2023   Last metabolic panel Lab Results  Component Value Date   GLUCOSE 91 11/15/2023   NA 137 11/15/2023   K 4.4 11/15/2023   CL 104 11/15/2023   CO2 23 11/15/2023   BUN 10 11/15/2023   CREATININE 0.79 11/15/2023   GFR 102.55 11/15/2023   CALCIUM 9.6 11/15/2023   PROT 7.5 11/15/2023   ALBUMIN 4.7 11/15/2023   BILITOT 0.5 11/15/2023   ALKPHOS 54 11/15/2023   AST 18 11/15/2023   ALT 20 11/15/2023   ANIONGAP 10 03/09/2020      The ASCVD Risk score (Arnett DK, et al., 2019) failed to calculate for the following reasons:   The 2019 ASCVD risk score is only valid for ages 97 to 77  Assessment & Plan:  Blood pressure elevated without history of HTN  Constipation, unspecified constipation type -     Calprotectin, Fecal -     ANCA screen with reflex titer -     linaCLOtide; Take 1 capsule (145 mcg total) by mouth daily before breakfast.  Dispense: 30 capsule; Refill: 0  Elevated hemoglobin (HCC) -     CBC with Differential/Platelet  Stool mucus -     Calprotectin, Fecal  Assessment and Plan    Constipation with mucous in stool Chronic constipation with occasional diarrhea and abdominal pain. Differential includes IBS vs. IBD. - Order stool test for inflammation to differentiate IBS from IBD.    Fecal calprotectin and ANCA level - Recommend daily MiraLAX, one capful in any beverage. - If no relief within one week, add 145mcg linzess daily.  Prehypertension Blood pressure improved after stopping estrogen-containing birth control. Current readings indicate prehypertension. No medication needed. - Monitor blood pressure. - Recommend non-estrogen contraception in  future if needed.  Elevated hemoglobin Hemoglobin levels increasing, possibly due to prior birth control use. Pt is non-smoker and denies sx of sleep apnea. - Repeat CBC today as pt has been off OCPs        No follow-ups on file.   Mandy Second, PA

## 2024-06-05 NOTE — Patient Instructions (Addendum)
 Please start taking miralax, one capful (17g) daily. Monitor your bowel response. Additional lab/ stool tests obtained today to rule out inflammatory bowel disease.  If you do not have response to miralax alone, please fill and start the linzess called in today.

## 2024-06-06 ENCOUNTER — Other Ambulatory Visit

## 2024-06-06 ENCOUNTER — Ambulatory Visit: Payer: Self-pay | Admitting: Urgent Care

## 2024-06-06 LAB — CBC WITH DIFFERENTIAL/PLATELET
Absolute Lymphocytes: 2045 {cells}/uL (ref 850–3900)
Absolute Monocytes: 391 {cells}/uL (ref 200–950)
Basophils Absolute: 64 {cells}/uL (ref 0–200)
Basophils Relative: 0.9 %
Eosinophils Absolute: 149 {cells}/uL (ref 15–500)
Eosinophils Relative: 2.1 %
HCT: 45.7 % — ABNORMAL HIGH (ref 35.0–45.0)
Hemoglobin: 15 g/dL (ref 11.7–15.5)
MCH: 30.9 pg (ref 27.0–33.0)
MCHC: 32.8 g/dL (ref 32.0–36.0)
MCV: 94.2 fL (ref 80.0–100.0)
MPV: 10.5 fL (ref 7.5–12.5)
Monocytes Relative: 5.5 %
Neutro Abs: 4452 {cells}/uL (ref 1500–7800)
Neutrophils Relative %: 62.7 %
Platelets: 274 10*3/uL (ref 140–400)
RBC: 4.85 10*6/uL (ref 3.80–5.10)
RDW: 11.7 % (ref 11.0–15.0)
Total Lymphocyte: 28.8 %
WBC: 7.1 10*3/uL (ref 3.8–10.8)

## 2024-06-08 LAB — ANCA SCREEN W REFLEX TITER: ANCA SCREEN: NEGATIVE

## 2024-06-08 LAB — CALPROTECTIN, FECAL: Calprotectin, Fecal: 24 ug/g (ref 0–120)

## 2024-06-15 ENCOUNTER — Ambulatory Visit: Payer: Self-pay
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
## 2024-06-16 ENCOUNTER — Ambulatory Visit: Admitting: Urgent Care

## 2024-06-16 ENCOUNTER — Encounter: Payer: Self-pay | Admitting: Urgent Care

## 2024-06-16 VITALS — BP 134/97 | HR 102 | Resp 20 | Ht 65.0 in | Wt 145.2 lb

## 2024-06-16 DIAGNOSIS — S161XXA Strain of muscle, fascia and tendon at neck level, initial encounter: Secondary | ICD-10-CM

## 2024-06-16 DIAGNOSIS — E7800 Pure hypercholesterolemia, unspecified: Secondary | ICD-10-CM

## 2024-06-16 MED ORDER — TIZANIDINE HCL 4 MG PO TABS: 4.0000 mg | ORAL_TABLET | Freq: Four times a day (QID) | ORAL | 0 refills | Status: AC | PRN

## 2024-06-16 MED ORDER — ETODOLAC 500 MG PO TABS: 500.0000 mg | ORAL_TABLET | Freq: Two times a day (BID) | ORAL | 0 refills | Status: AC

## 2024-06-16 NOTE — Patient Instructions (Addendum)
 You have cervical trigger point, which is knots in the muscles of the neck.  Please apply a warm moist compress, such as a microwavable heating pack, to your neck several times daily.  Take the muscle relaxer three times daily as needed. Keep in mind it may make you feel tired or drowsy, so do not operate machinery or drive a car until you know how it affects you.  Please take the anti-inflammatory medication called in today twice daily with food. Do not take any additional OTC NSAIDS (advil , motrin , ibuprofen , aleve, naproxen).    Take a warm epsom salt bath.  Try to stay hydrated with WATER as dehydration and caffeine intake can worsen this condition.  If you develop worsening pain, fever, or any new symptoms, please return to the clinic.
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
## 2024-06-17 ENCOUNTER — Encounter: Payer: Self-pay | Admitting: Urgent Care

## 2024-06-27 ENCOUNTER — Telehealth: Payer: Self-pay | Admitting: *Deleted

## 2024-06-27 NOTE — Telephone Encounter (Signed)
 Yes this is fine. Thank you

## 2024-06-27 NOTE — Telephone Encounter (Signed)
 Communication  Pt is requesting to transfer FROM: Barbara Mccullough    Pt is requesting to transfer TO: Philippe Slade    Reason for requested transfer: Local clinic near home    It is the responsibility of the team the patient would like to transfer to (Dr. Philippe Slade) to reach out to the patient if for any reason this transfer is not acceptable.

## 2024-06-27 NOTE — Telephone Encounter (Signed)
 Do I need this?

## 2024-07-04 ENCOUNTER — Encounter: Admitting: Family Medicine
